# Patient Record
Sex: Female | Born: 1990 | Race: Black or African American | Hispanic: No | Marital: Single | Smoking: Current some day smoker
Health system: Southern US, Community
[De-identification: ages and names within clinical notes are randomized; demographics above are authoritative.]

## PROBLEM LIST (undated history)

## (undated) ENCOUNTER — Inpatient Hospital Stay (HOSPITAL_COMMUNITY): Payer: Self-pay

## (undated) DIAGNOSIS — N159 Renal tubulo-interstitial disease, unspecified: Secondary | ICD-10-CM

## (undated) DIAGNOSIS — R51 Headache: Secondary | ICD-10-CM

## (undated) DIAGNOSIS — R519 Headache, unspecified: Secondary | ICD-10-CM

---
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
## 2014-04-30 LAB — OB RESULTS CONSOLE ANTIBODY SCREEN: Antibody Screen: NEGATIVE

## 2014-04-30 LAB — OB RESULTS CONSOLE HEPATITIS B SURFACE ANTIGEN: HEP B S AG: NEGATIVE

## 2014-04-30 LAB — OB RESULTS CONSOLE HIV ANTIBODY (ROUTINE TESTING): HIV: NONREACTIVE

## 2014-04-30 LAB — OB RESULTS CONSOLE ABO/RH: RH Type: POSITIVE

## 2014-04-30 LAB — OB RESULTS CONSOLE RUBELLA ANTIBODY, IGM: Rubella: IMMUNE

## 2014-04-30 LAB — OB RESULTS CONSOLE GC/CHLAMYDIA
Chlamydia: NEGATIVE
Gonorrhea: NEGATIVE

## 2014-04-30 LAB — OB RESULTS CONSOLE RPR: RPR: NONREACTIVE

## 2014-05-19 ENCOUNTER — Encounter: Payer: Medicaid Other | Admitting: Obstetrics

## 2014-07-03 ENCOUNTER — Encounter (HOSPITAL_COMMUNITY): Payer: Self-pay | Admitting: *Deleted

## 2014-07-03 ENCOUNTER — Inpatient Hospital Stay (HOSPITAL_COMMUNITY)
Admission: AD | Admit: 2014-07-03 | Discharge: 2014-07-03 | Disposition: A | Discharge status: Home / Self Care | Payer: BLUE CROSS/BLUE SHIELD | Source: Ambulatory Visit | Attending: Obstetrics and Gynecology | Admitting: Obstetrics and Gynecology

## 2014-07-03 DIAGNOSIS — O9989 Other specified diseases and conditions complicating pregnancy, childbirth and the puerperium: Secondary | ICD-10-CM | POA: Diagnosis present

## 2014-07-03 DIAGNOSIS — O99342 Other mental disorders complicating pregnancy, second trimester: Secondary | ICD-10-CM | POA: Insufficient documentation

## 2014-07-03 DIAGNOSIS — F418 Other specified anxiety disorders: Secondary | ICD-10-CM | POA: Diagnosis not present

## 2014-07-03 DIAGNOSIS — N189 Chronic kidney disease, unspecified: Secondary | ICD-10-CM | POA: Diagnosis not present

## 2014-07-03 DIAGNOSIS — Z3A25 25 weeks gestation of pregnancy: Secondary | ICD-10-CM | POA: Insufficient documentation

## 2014-07-03 DIAGNOSIS — F419 Anxiety disorder, unspecified: Secondary | ICD-10-CM | POA: Diagnosis not present

## 2014-07-03 DIAGNOSIS — O26832 Pregnancy related renal disease, second trimester: Secondary | ICD-10-CM | POA: Diagnosis not present

## 2014-07-03 HISTORY — DX: Renal tubulo-interstitial disease, unspecified: N15.9

## 2014-07-03 LAB — URINALYSIS, ROUTINE W REFLEX MICROSCOPIC
Bilirubin Urine: NEGATIVE
Glucose, UA: NEGATIVE mg/dL
Ketones, ur: NEGATIVE mg/dL
Nitrite: NEGATIVE
Protein, ur: 30 mg/dL — AB
Specific Gravity, Urine: 1.01 (ref 1.005–1.030)
Urobilinogen, UA: 0.2 mg/dL (ref 0.0–1.0)
pH: 7 (ref 5.0–8.0)

## 2014-07-03 LAB — URINE MICROSCOPIC-ADD ON
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
## 2014-07-03 NOTE — Discharge Instructions (Signed)
Panic Attacks °Panic attacks are sudden, short feelings of great fear or discomfort. You may have them for no reason when you are relaxed, when you are uneasy (anxious), or when you are sleeping.  °HOME CARE °· Take all your medicines as told. °· Check with your doctor before starting new medicines. °· Keep all doctor visits. °GET HELP IF: °· You are not able to take your medicines as told. °· Your symptoms do not get better. °· Your symptoms get worse. °GET HELP RIGHT AWAY IF: °· Your attacks seem different than your normal attacks. °· You have thoughts about hurting yourself or others. °· You take panic attack medicine and you have a side effect. °MAKE SURE YOU: °· Understand these instructions. °· Will watch your condition. °· Will get help right away if you are not doing well or get worse. °Document Released: 06/18/2010 Document Revised: 03/06/2013 Document Reviewed: 12/28/2012 °ExitCare® Patient Information ©2015 [employer]. This information is not intended to replace advice given to you by your health care provider. Make sure you discuss any questions you have with your health care provider. ° °

## 2014-09-11 LAB — OB RESULTS CONSOLE GBS: STREP GROUP B AG: POSITIVE

## 2014-10-22 ENCOUNTER — Inpatient Hospital Stay (HOSPITAL_COMMUNITY)
Admission: RE | Admit: 2014-10-22 | Discharge: 2014-10-26 | DRG: 766 | Disposition: A | Discharge status: Home / Self Care | Payer: Medicaid Other | Source: Ambulatory Visit | Attending: Obstetrics & Gynecology | Admitting: Obstetrics & Gynecology

## 2014-10-22 ENCOUNTER — Encounter (HOSPITAL_COMMUNITY): Payer: Self-pay

## 2014-10-22 VITALS — BP 136/91 | HR 63 | Temp 97.4°F | Resp 16 | Ht 63.0 in | Wt 181.0 lb

## 2014-10-22 DIAGNOSIS — O480 Post-term pregnancy: Secondary | ICD-10-CM | POA: Diagnosis present

## 2014-10-22 DIAGNOSIS — Z3A40 40 weeks gestation of pregnancy: Secondary | ICD-10-CM | POA: Diagnosis present

## 2014-10-22 DIAGNOSIS — O99824 Streptococcus B carrier state complicating childbirth: Secondary | ICD-10-CM | POA: Diagnosis present

## 2014-10-22 DIAGNOSIS — Z98891 History of uterine scar from previous surgery: Secondary | ICD-10-CM

## 2014-10-22 LAB — CBC
HCT: 39.7 % (ref 36.0–46.0)
Hemoglobin: 13.8 g/dL (ref 12.0–15.0)
MCH: 33.1 pg (ref 26.0–34.0)
MCHC: 34.8 g/dL (ref 30.0–36.0)
MCV: 95.2 fL (ref 78.0–100.0)
Platelets: 320 10*3/uL (ref 150–400)
RBC: 4.17 MIL/uL (ref 3.87–5.11)
RDW: 13.2 % (ref 11.5–15.5)
WBC: 12.4 10*3/uL — ABNORMAL HIGH (ref 4.0–10.5)

## 2014-10-22 LAB — TYPE AND SCREEN
ABO/RH(D): O POS
Antibody Screen: NEGATIVE

## 2014-10-22 MED ORDER — OXYTOCIN BOLUS FROM INFUSION: 500.0000 mL | INTRAVENOUS | Status: DC

## 2014-10-22 MED ORDER — OXYTOCIN 40 UNITS IN LACTATED RINGERS INFUSION - SIMPLE MED: 62.5000 mL/h | INTRAVENOUS | Status: DC

## 2014-10-22 MED ORDER — OXYCODONE-ACETAMINOPHEN 5-325 MG PO TABS
2.0000 | ORAL_TABLET | ORAL | Status: DC | PRN
Start: 2014-10-22 — End: 2014-10-24

## 2014-10-22 MED ORDER — TERBUTALINE SULFATE 1 MG/ML IJ SOLN: 0.2500 mg | Freq: Once | INTRAMUSCULAR | Status: AC | PRN

## 2014-10-22 MED ORDER — MISOPROSTOL 25 MCG QUARTER TABLET: 25.0000 ug | ORAL_TABLET | ORAL | Status: DC | PRN

## 2014-10-22 MED ORDER — PENICILLIN G POTASSIUM 5000000 UNITS IJ SOLR
5.0000 10*6.[IU] | Freq: Once | INTRAVENOUS | Status: AC
  Administered 2014-10-23: 5 10*6.[IU] via INTRAVENOUS
  Filled 2014-10-22: qty 5

## 2014-10-22 MED ORDER — LACTATED RINGERS IV SOLN: 500.0000 mL | INTRAVENOUS | Status: DC | PRN

## 2014-10-22 MED ORDER — ACETAMINOPHEN 325 MG PO TABS: 650.0000 mg | ORAL_TABLET | ORAL | Status: DC | PRN

## 2014-10-22 MED ORDER — PENICILLIN G POTASSIUM 5000000 UNITS IJ SOLR
2.5000 10*6.[IU] | INTRAVENOUS | Status: DC
  Administered 2014-10-23 – 2014-10-24 (×3): 2.5 10*6.[IU] via INTRAVENOUS
  Filled 2014-10-22 (×8): qty 2.5

## 2014-10-22 MED ORDER — BUTORPHANOL TARTRATE 1 MG/ML IJ SOLN
1.0000 mg | INTRAMUSCULAR | Status: DC | PRN
  Administered 2014-10-23: 1 mg via INTRAVENOUS
  Filled 2014-10-22: qty 1

## 2014-10-22 MED ORDER — CITRIC ACID-SODIUM CITRATE 334-500 MG/5ML PO SOLN
30.0000 mL | ORAL | Status: DC | PRN
  Administered 2014-10-24: 30 mL via ORAL
  Filled 2014-10-22: qty 15

## 2014-10-22 MED ORDER — OXYCODONE-ACETAMINOPHEN 5-325 MG PO TABS: 1.0000 | ORAL_TABLET | ORAL | Status: DC | PRN

## 2014-10-22 MED ORDER — DIPHENHYDRAMINE HCL 25 MG PO CAPS
25.0000 mg | ORAL_CAPSULE | Freq: Every evening | ORAL | Status: DC | PRN
  Administered 2014-10-22: 25 mg via ORAL
  Filled 2014-10-22: qty 1

## 2014-10-22 MED ORDER — LIDOCAINE HCL (PF) 1 % IJ SOLN: 30.0000 mL | INTRAMUSCULAR | Status: DC | PRN

## 2014-10-22 MED ORDER — ONDANSETRON HCL 4 MG/2ML IJ SOLN
4.0000 mg | Freq: Four times a day (QID) | INTRAMUSCULAR | Status: DC | PRN
  Administered 2014-10-24: 4 mg via INTRAVENOUS

## 2014-10-22 MED ORDER — LACTATED RINGERS IV SOLN
INTRAVENOUS | Status: DC
  Administered 2014-10-22 – 2014-10-24 (×7): via INTRAVENOUS

## 2014-10-22 NOTE — Progress Notes (Signed)
Foley bulb placed and filled with 60 cc NS without difficulty. Toco: q2 min EFM: 150s, mod var, Cat 1 FSR

## 2014-10-22 NOTE — H&P (Signed)
24 y.o. G1P0 @ 4513w6d presents for IOL for post-term pregnancy.  Is noted to be contracting q2-3 min, reports mild to moderate discomfort.  Otherwise has good fetal movement and no bleeding.  Past Medical History  Diagnosis Date  . Kidney infection    History reviewed. No pertinent past surgical history.  OB History  Gravida Para Term Preterm AB SAB TAB Ectopic Multiple Living  1             # Outcome Date GA Lbr Len/2nd Weight Sex Delivery Anes PTL Lv  1 Current               History   Social History  . Marital Status: Single    Spouse Name: N/A  . Number of Children: N/A  . Years of Education: N/A   Occupational History  . Not on file.   Social History Main Topics  . Smoking status: Never Smoker   . Smokeless tobacco: Not on file  . Alcohol Use: No  . Drug Use: No  . Sexual Activity: Yes   Other Topics Concern  . Not on file   Social History Narrative   Review of patient's allergies indicates no known allergies.    Prenatal Transfer Tool  Maternal Diabetes: No Genetic Screening: Normal Maternal Ultrasounds/Referrals: Normal Fetal Ultrasounds or other Referrals:  None Maternal Substance Abuse:  No Significant Maternal Medications:  None Significant Maternal Lab Results: Lab values include: Group B Strep positive  ABO, Rh: O/Positive/-- (12/02 0000) Antibody: Negative (12/02 0000) Rubella:  Immune RPR: Nonreactive (12/02 0000)  HBsAg: Negative (12/02 0000)  HIV: Non-reactive (12/02 0000)  GBS: Positive (04/14 0000)    Other PNC:  1. Chlamydia at 35 wks, s/p treatment    Filed Vitals:   10/22/14 2015  BP: 130/83  Pulse: 82  Resp: 18     General:  NAD Abdomen:  soft, gravid, EFW 6.5# Ex:  no edema SVE:  1/long/posterior/firm/ballotable FHTs:  120s, mod var, 10 x 10 accels, Cat 1 Toco:  q2 min   A/P   24 y.o. 7513w6d  G1P0 presents for IOL for post dates pregnancy IOL--contracting too frequently for cytotec.  Will place foley bulb Epidural upon  maternal request FSR/ vtx/ GBS positive--PCN  [facility]Joao Mccurdy GEFFEL Aliahna Statzer

## 2014-10-23 ENCOUNTER — Inpatient Hospital Stay (HOSPITAL_COMMUNITY): Payer: Medicaid Other | Admitting: Anesthesiology

## 2014-10-23 LAB — RPR: RPR Ser Ql: NONREACTIVE

## 2014-10-23 LAB — ABO/RH: ABO/RH(D): O POS

## 2014-10-23 MED ORDER — OXYTOCIN 40 UNITS IN LACTATED RINGERS INFUSION - SIMPLE MED
1.0000 m[IU]/min | INTRAVENOUS | Status: DC
  Administered 2014-10-23: 2 m[IU]/min via INTRAVENOUS
  Filled 2014-10-23: qty 1000

## 2014-10-23 MED ORDER — PHENYLEPHRINE 40 MCG/ML (10ML) SYRINGE FOR IV PUSH (FOR BLOOD PRESSURE SUPPORT): 80.0000 ug | PREFILLED_SYRINGE | INTRAVENOUS | Status: DC | PRN

## 2014-10-23 MED ORDER — DIPHENHYDRAMINE HCL 50 MG/ML IJ SOLN: 12.5000 mg | INTRAMUSCULAR | Status: DC | PRN

## 2014-10-23 MED ORDER — TERBUTALINE SULFATE 1 MG/ML IJ SOLN: 0.2500 mg | Freq: Once | INTRAMUSCULAR | Status: AC | PRN

## 2014-10-23 MED ORDER — EPHEDRINE 5 MG/ML INJ: 10.0000 mg | INTRAVENOUS | Status: DC | PRN

## 2014-10-23 MED ORDER — PHENYLEPHRINE 40 MCG/ML (10ML) SYRINGE FOR IV PUSH (FOR BLOOD PRESSURE SUPPORT)
80.0000 ug | PREFILLED_SYRINGE | INTRAVENOUS | Status: DC | PRN
  Administered 2014-10-23 (×2): 80 ug via INTRAVENOUS

## 2014-10-23 MED ORDER — LIDOCAINE HCL (PF) 1 % IJ SOLN
INTRAMUSCULAR | Status: DC | PRN
  Administered 2014-10-23 (×2): 4 mL

## 2014-10-23 MED ORDER — PHENYLEPHRINE 40 MCG/ML (10ML) SYRINGE FOR IV PUSH (FOR BLOOD PRESSURE SUPPORT)
80.0000 ug | PREFILLED_SYRINGE | INTRAVENOUS | Status: DC | PRN
  Filled 2014-10-23: qty 20

## 2014-10-23 MED ORDER — FENTANYL 2.5 MCG/ML BUPIVACAINE 1/10 % EPIDURAL INFUSION (WH - ANES)
13.0000 mL/h | INTRAMUSCULAR | Status: DC | PRN
  Administered 2014-10-23 (×2): 13 mL/h via EPIDURAL

## 2014-10-23 MED ORDER — FENTANYL 2.5 MCG/ML BUPIVACAINE 1/10 % EPIDURAL INFUSION (WH - ANES)
14.0000 mL/h | INTRAMUSCULAR | Status: DC | PRN
  Administered 2014-10-23: 13 mL/h via EPIDURAL
  Filled 2014-10-23 (×2): qty 125

## 2014-10-23 NOTE — Anesthesia Procedure Notes (Signed)
Epidural Patient location during procedure: OB Start time: 10/23/2014 12:29 PM  Staffing Anesthesiologist: Mal AmabileFOSTER, Srihitha Tagliaferri Performed by: anesthesiologist   Preanesthetic Checklist Completed: patient identified, site marked, surgical consent, pre-op evaluation, timeout performed, IV checked, risks and benefits discussed and monitors and equipment checked  Epidural Patient position: sitting Prep: site prepped and draped and DuraPrep Patient monitoring: continuous pulse ox and blood pressure Approach: midline Location: L3-L4 Injection technique: LOR air  Needle:  Needle type: Tuohy  Needle gauge: 17 G Needle length: 9 cm and 9 Needle insertion depth: 7 cm Catheter type: closed end flexible Catheter size: 19 Gauge Catheter at skin depth: 12 cm Test dose: negative and Other  Assessment Events: blood not aspirated, injection not painful, no injection resistance, negative IV test and no paresthesia  Additional Notes Patient identified. Risks and benefits discussed including failed block, incomplete  Pain control, post dural puncture headache, nerve damage, paralysis, blood pressure Changes, nausea, vomiting, reactions to medications-both toxic and allergic and post Partum back pain. All questions were answered. Patient expressed understanding and wished to proceed. Sterile technique was used throughout procedure. Epidural site was Dressed with sterile barrier dressing. No paresthesias, signs of intravascular injection Or signs of intrathecal spread were encountered.  Patient was more comfortable after the epidural was dosed. Please see RN's note for documentation of vital signs and FHR which are stable.

## 2014-10-23 NOTE — Progress Notes (Signed)
Whitney Ayala is a [age] y.o. G1P0 at 4454w0d by LMP admitted for induction of labor due to Post dates.   Subjective: Patient more comfortable  Objective: BP 136/81 mmHg  Pulse 92  Temp(Src) 97.8 F (36.6 C) (Oral)  Resp 20  Ht 5\' 3"  (1.6 m)  Wt 82.101 kg (181 lb)  BMI 32.07 kg/m2  LMP 01/11/2014      FHT:  FHR: 140 bpm, variability: moderate,  accelerations:  Present,  decelerations:  Absent and   UC:   regular, every 2 minutes SVE:   Dilation: 1 Effacement (%): Thick Station: Ballotable Exam by:: Dr Kemper Durielarke  Labs: Lab Results  Component Value Date   WBC 12.4* 10/22/2014   HGB 13.8 10/22/2014   HCT 39.7 10/22/2014   MCV 95.2 10/22/2014   PLT 320 10/22/2014    Assessment / Plan: Induction of labor due to postterm,  progressing well on pitocin  Labor: early labor Preeclampsia:  no signs or symptoms of toxicity Fetal Wellbeing:  Category I Pain Control:  Labor support without medications I/D:  n/a Anticipated MOD:  NSVD  Whitney Ayala STACIA 10/23/2014, 9:01 AM

## 2014-10-23 NOTE — Anesthesia Preprocedure Evaluation (Addendum)
Anesthesia Evaluation  Patient identified by MRN, date of birth, ID band Patient awake    Reviewed: Allergy & Precautions, H&P , Patient's Chart, lab work & pertinent test results  History of Anesthesia Complications Negative for: history of anesthetic complications  Airway Mallampati: III  TM Distance: >3 FB Neck ROM: full    Dental no notable dental hx. (+) Teeth Intact   Pulmonary neg pulmonary ROS,  breath sounds clear to auscultation  Pulmonary exam normal       Cardiovascular negative cardio ROS Normal cardiovascular examRhythm:regular Rate:Normal     Neuro/Psych negative neurological ROS  negative psych ROS   GI/Hepatic negative GI ROS, Neg liver ROS,   Endo/Other  Obesity  Renal/GU Renal disease  negative genitourinary   Musculoskeletal   Abdominal   Peds  Hematology negative hematology ROS (+)   Anesthesia Other Findings   Reproductive/Obstetrics (+) Pregnancy                            Anesthesia Physical Anesthesia Plan  ASA: II  Anesthesia Plan: Epidural   Post-op Pain Management:    Induction:   Airway Management Planned: Natural Airway  Additional Equipment: None  Intra-op Plan:   Post-operative Plan:   Informed Consent: I have reviewed the patients History and Physical, chart, labs and discussed the procedure including the risks, benefits and alternatives for the proposed anesthesia with the patient or authorized representative who has indicated his/her understanding and acceptance.   Dental advisory given  Plan Discussed with: CRNA and Surgeon  Anesthesia Plan Comments:        Anesthesia Quick Evaluation

## 2014-10-23 NOTE — Progress Notes (Signed)
Whitney Ayala is a 24 y.o. G1P0 at 2569w0d by LMP admitted for induction of labor due to Post dates.   Subjective: Patient comfortable with epidural   Objective: BP 106/53 mmHg  Pulse 82  Temp(Src) 97.8 F (36.6 C) (Oral)  Resp 20  Ht 5\' 3"  (1.6 m)  Wt 82.101 kg (181 lb)  BMI 32.07 kg/m2  SpO2 100%  LMP 01/11/2014      FHT:  FHR: 135 bpm, variability: moderate,  accelerations:  Present,  decelerations:  Absent UC:   regular, every 3 minutes SVE:   Dilation: 6 Effacement (%): 90 Station: -2 Exam by:: Dr Mora ApplPinn AROM - clear fluid  Labs: Lab Results  Component Value Date   WBC 12.4* 10/22/2014   HGB 13.8 10/22/2014   HCT 39.7 10/22/2014   MCV 95.2 10/22/2014   PLT 320 10/22/2014    Assessment / Plan: Induction of labor due to postterm,  progressing well on pitocin  Labor: progressing on pitocin Preeclampsia:  no signs or symptoms of toxicity Fetal Wellbeing:  Category I Pain Control:  Epidural I/D:  n/a Anticipated MOD:  NSVD  Whitney Ayala Whitney Ayala 10/23/2014, 3:39 PM

## 2014-10-23 NOTE — Progress Notes (Signed)
Whitney Ayala is a 24 y.o. G1P0 at 4034w0d by LMP admitted for induction of labor due to Post dates.   Subjective: Patient comfortable with epidural  Objective: BP 136/89 mmHg  Pulse 111  Temp(Src) 99.6 F (37.6 C) (Axillary)  Resp 20  Ht 5\' 3"  (1.6 m)  Wt 82.101 kg (181 lb)  BMI 32.07 kg/m2  SpO2 100%  LMP 01/11/2014   Total I/O In: -  Out: 375 [Urine:375]  FHT:  FHR: 140 bpm, variability: moderate,  accelerations:  Present,  decelerations:  Absent UC:   regular, every 1-2 minutes SVE:   Dilation: 6 Effacement (%): 70 Station: -1 Exam by:: Lauraine RinneKristin Fraley, RN  Labs: Lab Results  Component Value Date   WBC 12.4* 10/22/2014   HGB 13.8 10/22/2014   HCT 39.7 10/22/2014   MCV 95.2 10/22/2014   PLT 320 10/22/2014    Assessment / Plan: SIUP at 41 weeks admitted for IOL stalled at 6 cm IUPC placed for monitoring of MVUs Patient placed on side with the peanut.  We discussed the possibility of arrest of labor and cesarean delivery, will closely monitor.   Whitney Ayala, Whitney Ayala Whitney Ayala 10/23/2014, 11:03 PM

## 2014-10-24 ENCOUNTER — Encounter (HOSPITAL_COMMUNITY)
Admission: RE | Disposition: A | Discharge status: Home / Self Care | Payer: Self-pay | Source: Ambulatory Visit | Attending: Obstetrics & Gynecology

## 2014-10-24 ENCOUNTER — Encounter (HOSPITAL_COMMUNITY): Payer: Self-pay

## 2014-10-24 DIAGNOSIS — Z98891 History of uterine scar from previous surgery: Secondary | ICD-10-CM

## 2014-10-24 SURGERY — Surgical Case
Anesthesia: Epidural | Site: Abdomen

## 2014-10-24 MED ORDER — NALOXONE HCL 0.4 MG/ML IJ SOLN: 0.4000 mg | INTRAMUSCULAR | Status: DC | PRN

## 2014-10-24 MED ORDER — NALOXONE HCL 1 MG/ML IJ SOLN
1.0000 ug/kg/h | INTRAVENOUS | Status: DC | PRN
  Filled 2014-10-24: qty 2

## 2014-10-24 MED ORDER — BUPIVACAINE LIPOSOME 1.3 % IJ SUSP
20.0000 mL | Freq: Once | INTRAMUSCULAR | Status: DC
  Filled 2014-10-24: qty 20

## 2014-10-24 MED ORDER — DIPHENHYDRAMINE HCL 25 MG PO CAPS: 25.0000 mg | ORAL_CAPSULE | Freq: Four times a day (QID) | ORAL | Status: DC | PRN

## 2014-10-24 MED ORDER — LIDOCAINE-EPINEPHRINE (PF) 2 %-1:200000 IJ SOLN
INTRAMUSCULAR | Status: AC
  Filled 2014-10-24: qty 20

## 2014-10-24 MED ORDER — OXYTOCIN 10 UNIT/ML IJ SOLN
INTRAMUSCULAR | Status: AC
  Filled 2014-10-24: qty 4

## 2014-10-24 MED ORDER — OXYCODONE-ACETAMINOPHEN 5-325 MG PO TABS: 2.0000 | ORAL_TABLET | ORAL | Status: DC | PRN

## 2014-10-24 MED ORDER — SODIUM BICARBONATE 8.4 % IV SOLN
INTRAVENOUS | Status: DC | PRN
Start: 2014-10-24 — End: 2014-11-03
  Administered 2014-10-24: 10 mL via EPIDURAL
  Administered 2014-10-24: 5 mL via EPIDURAL

## 2014-10-24 MED ORDER — SIMETHICONE 80 MG PO CHEW: 80.0000 mg | CHEWABLE_TABLET | ORAL | Status: DC | PRN

## 2014-10-24 MED ORDER — OXYTOCIN 40 UNITS IN LACTATED RINGERS INFUSION - SIMPLE MED: 62.5000 mL/h | INTRAVENOUS | Status: AC

## 2014-10-24 MED ORDER — PHENYLEPHRINE HCL 10 MG/ML IJ SOLN
INTRAMUSCULAR | Status: DC | PRN
  Administered 2014-10-24: 40 ug via INTRAVENOUS
  Administered 2014-10-24: 80 ug via INTRAVENOUS

## 2014-10-24 MED ORDER — SODIUM BICARBONATE 8.4 % IV SOLN
INTRAVENOUS | Status: AC
  Filled 2014-10-24: qty 50

## 2014-10-24 MED ORDER — CEFAZOLIN SODIUM-DEXTROSE 2-3 GM-% IV SOLR
INTRAVENOUS | Status: AC
  Filled 2014-10-24: qty 50

## 2014-10-24 MED ORDER — DIPHENHYDRAMINE HCL 25 MG PO CAPS: 25.0000 mg | ORAL_CAPSULE | ORAL | Status: DC | PRN

## 2014-10-24 MED ORDER — FENTANYL CITRATE (PF) 100 MCG/2ML IJ SOLN: 25.0000 ug | INTRAMUSCULAR | Status: DC | PRN

## 2014-10-24 MED ORDER — SIMETHICONE 80 MG PO CHEW
80.0000 mg | CHEWABLE_TABLET | ORAL | Status: DC
  Administered 2014-10-25 – 2014-10-26 (×2): 80 mg via ORAL
  Filled 2014-10-24 (×3): qty 1

## 2014-10-24 MED ORDER — ONDANSETRON HCL 4 MG/2ML IJ SOLN
INTRAMUSCULAR | Status: AC
  Filled 2014-10-24: qty 2

## 2014-10-24 MED ORDER — ONDANSETRON HCL 4 MG/2ML IJ SOLN: 4.0000 mg | Freq: Three times a day (TID) | INTRAMUSCULAR | Status: DC | PRN

## 2014-10-24 MED ORDER — SODIUM CHLORIDE 0.9 % IJ SOLN
INTRAMUSCULAR | Status: DC | PRN
  Administered 2014-10-24: 10 mL via INTRAVENOUS

## 2014-10-24 MED ORDER — MENTHOL 3 MG MT LOZG: 1.0000 | LOZENGE | OROMUCOSAL | Status: DC | PRN

## 2014-10-24 MED ORDER — CEFAZOLIN SODIUM-DEXTROSE 2-3 GM-% IV SOLR
2.0000 g | INTRAVENOUS | Status: AC
  Administered 2014-10-24: 2 g via INTRAVENOUS
  Filled 2014-10-24: qty 50

## 2014-10-24 MED ORDER — NALBUPHINE HCL 10 MG/ML IJ SOLN: 5.0000 mg | INTRAMUSCULAR | Status: DC | PRN

## 2014-10-24 MED ORDER — MORPHINE SULFATE 0.5 MG/ML IJ SOLN
INTRAMUSCULAR | Status: AC
  Filled 2014-10-24: qty 10

## 2014-10-24 MED ORDER — ZOLPIDEM TARTRATE 5 MG PO TABS: 5.0000 mg | ORAL_TABLET | Freq: Every evening | ORAL | Status: DC | PRN

## 2014-10-24 MED ORDER — SODIUM CHLORIDE 0.9 % IJ SOLN
INTRAMUSCULAR | Status: AC
  Filled 2014-10-24: qty 10

## 2014-10-24 MED ORDER — NALBUPHINE HCL 10 MG/ML IJ SOLN: 5.0000 mg | Freq: Once | INTRAMUSCULAR | Status: AC | PRN

## 2014-10-24 MED ORDER — IBUPROFEN 600 MG PO TABS
600.0000 mg | ORAL_TABLET | Freq: Four times a day (QID) | ORAL | Status: DC
  Administered 2014-10-25 – 2014-10-26 (×7): 600 mg via ORAL
  Filled 2014-10-24 (×8): qty 1

## 2014-10-24 MED ORDER — SIMETHICONE 80 MG PO CHEW
80.0000 mg | CHEWABLE_TABLET | Freq: Three times a day (TID) | ORAL | Status: DC
  Administered 2014-10-24 – 2014-10-26 (×5): 80 mg via ORAL
  Filled 2014-10-24 (×6): qty 1

## 2014-10-24 MED ORDER — SODIUM CHLORIDE 0.9 % IJ SOLN: 3.0000 mL | INTRAMUSCULAR | Status: DC | PRN

## 2014-10-24 MED ORDER — SCOPOLAMINE 1 MG/3DAYS TD PT72
1.0000 | MEDICATED_PATCH | Freq: Once | TRANSDERMAL | Status: DC
  Filled 2014-10-24: qty 1

## 2014-10-24 MED ORDER — MORPHINE SULFATE (PF) 0.5 MG/ML IJ SOLN: INTRAMUSCULAR | Status: DC | PRN

## 2014-10-24 MED ORDER — DIPHENHYDRAMINE HCL 50 MG/ML IJ SOLN: 12.5000 mg | INTRAMUSCULAR | Status: DC | PRN

## 2014-10-24 MED ORDER — TETANUS-DIPHTH-ACELL PERTUSSIS 5-2.5-18.5 LF-MCG/0.5 IM SUSP: 0.5000 mL | Freq: Once | INTRAMUSCULAR | Status: DC

## 2014-10-24 MED ORDER — KETOROLAC TROMETHAMINE 30 MG/ML IJ SOLN
30.0000 mg | Freq: Four times a day (QID) | INTRAMUSCULAR | Status: AC | PRN
  Administered 2014-10-24: 30 mg via INTRAVENOUS
  Filled 2014-10-24: qty 1

## 2014-10-24 MED ORDER — MEPERIDINE HCL 25 MG/ML IJ SOLN: 6.2500 mg | INTRAMUSCULAR | Status: DC | PRN

## 2014-10-24 MED ORDER — KETOROLAC TROMETHAMINE 30 MG/ML IJ SOLN
INTRAMUSCULAR | Status: AC
  Administered 2014-10-24: 30 mg via INTRAVENOUS
  Filled 2014-10-24: qty 1

## 2014-10-24 MED ORDER — OXYTOCIN 10 UNIT/ML IJ SOLN
40.0000 [IU] | INTRAVENOUS | Status: DC | PRN
  Administered 2014-10-24: 40 [IU] via INTRAVENOUS

## 2014-10-24 MED ORDER — KETOROLAC TROMETHAMINE 30 MG/ML IJ SOLN
30.0000 mg | Freq: Four times a day (QID) | INTRAMUSCULAR | Status: AC | PRN
  Administered 2014-10-24: 30 mg via INTRAMUSCULAR

## 2014-10-24 MED ORDER — DIBUCAINE 1 % RE OINT: 1.0000 "application " | TOPICAL_OINTMENT | RECTAL | Status: DC | PRN

## 2014-10-24 MED ORDER — OXYCODONE-ACETAMINOPHEN 5-325 MG PO TABS
1.0000 | ORAL_TABLET | ORAL | Status: DC | PRN
  Administered 2014-10-25 – 2014-10-26 (×3): 1 via ORAL
  Filled 2014-10-24 (×3): qty 1

## 2014-10-24 MED ORDER — ACETAMINOPHEN 325 MG PO TABS: 650.0000 mg | ORAL_TABLET | ORAL | Status: DC | PRN

## 2014-10-24 MED ORDER — BUPIVACAINE LIPOSOME 1.3 % IJ SUSP
INTRAMUSCULAR | Status: DC | PRN
  Administered 2014-10-24: 20 mL

## 2014-10-24 MED ORDER — SENNOSIDES-DOCUSATE SODIUM 8.6-50 MG PO TABS
2.0000 | ORAL_TABLET | ORAL | Status: DC
  Administered 2014-10-25: 2 via ORAL
  Administered 2014-10-26 (×2): 1 via ORAL
  Filled 2014-10-24 (×3): qty 2

## 2014-10-24 MED ORDER — PRENATAL MULTIVITAMIN CH
1.0000 | ORAL_TABLET | Freq: Every day | ORAL | Status: DC
Start: 2014-10-24 — End: 2014-10-26
  Administered 2014-10-24 – 2014-10-26 (×3): 1 via ORAL
  Filled 2014-10-24 (×3): qty 1

## 2014-10-24 MED ORDER — LACTATED RINGERS IV SOLN
INTRAVENOUS | Status: DC
  Administered 2014-10-24 (×2): via INTRAVENOUS

## 2014-10-24 MED ORDER — LANOLIN HYDROUS EX OINT: 1.0000 "application " | TOPICAL_OINTMENT | CUTANEOUS | Status: DC | PRN

## 2014-10-24 MED ORDER — MORPHINE SULFATE (PF) 0.5 MG/ML IJ SOLN
INTRAMUSCULAR | Status: DC | PRN
  Administered 2014-10-24: 3000 ug via EPIDURAL

## 2014-10-24 MED ORDER — WITCH HAZEL-GLYCERIN EX PADS: 1.0000 "application " | MEDICATED_PAD | CUTANEOUS | Status: DC | PRN

## 2014-10-24 SURGICAL SUPPLY — 38 items
APL SKNCLS STERI-STRIP NONHPOA (GAUZE/BANDAGES/DRESSINGS) ×1
BENZOIN TINCTURE PRP APPL 2/3 (GAUZE/BANDAGES/DRESSINGS) ×3 IMPLANT
CLAMP CORD UMBIL (MISCELLANEOUS) ×2 IMPLANT
CLOSURE WOUND 1/2 X4 (GAUZE/BANDAGES/DRESSINGS) ×1
CLOTH BEACON ORANGE TIMEOUT ST (SAFETY) ×3 IMPLANT
DRAPE SHEET LG 3/4 BI-LAMINATE (DRAPES) IMPLANT
DRSG OPSITE POSTOP 4X10 (GAUZE/BANDAGES/DRESSINGS) ×3 IMPLANT
DURAPREP 26ML APPLICATOR (WOUND CARE) ×3 IMPLANT
ELECT REM PT RETURN 9FT ADLT (ELECTROSURGICAL) ×3
ELECTRODE REM PT RTRN 9FT ADLT (ELECTROSURGICAL) ×1 IMPLANT
EXTRACTOR VACUUM BELL STYLE (SUCTIONS) IMPLANT
GLOVE BIO SURGEON STRL SZ 6.5 (GLOVE) ×2 IMPLANT
GLOVE BIO SURGEONS STRL SZ 6.5 (GLOVE) ×1
GLOVE BIOGEL PI IND STRL 7.0 (GLOVE) ×1 IMPLANT
GLOVE BIOGEL PI INDICATOR 7.0 (GLOVE) ×2
GOWN STRL REUS W/TWL LRG LVL3 (GOWN DISPOSABLE) ×9 IMPLANT
KIT ABG SYR 3ML LUER SLIP (SYRINGE) IMPLANT
NDL HYPO 25X5/8 SAFETYGLIDE (NEEDLE) IMPLANT
NEEDLE HYPO 22GX1.5 SAFETY (NEEDLE) IMPLANT
NEEDLE HYPO 25X5/8 SAFETYGLIDE (NEEDLE) IMPLANT
NS IRRIG 1000ML POUR BTL (IV SOLUTION) ×3 IMPLANT
PACK C SECTION WH (CUSTOM PROCEDURE TRAY) ×3 IMPLANT
PAD OB MATERNITY 4.3X12.25 (PERSONAL CARE ITEMS) ×3 IMPLANT
RTRCTR C-SECT PINK 25CM LRG (MISCELLANEOUS) ×3 IMPLANT
STAPLER VISISTAT 35W (STAPLE) IMPLANT
STRIP CLOSURE SKIN 1/2X4 (GAUZE/BANDAGES/DRESSINGS) ×2 IMPLANT
SUT CHROMIC 2 0 CT 1 (SUTURE) ×6 IMPLANT
SUT MNCRL 0 VIOLET CTX 36 (SUTURE) ×2 IMPLANT
SUT MONOCRYL 0 CTX 36 (SUTURE) ×4
SUT PDS AB 0 CTX 36 PDP370T (SUTURE) IMPLANT
SUT PLAIN 2 0 (SUTURE) ×3
SUT PLAIN ABS 2-0 CT1 27XMFL (SUTURE) IMPLANT
SUT VIC AB 0 CTX 36 (SUTURE) ×3
SUT VIC AB 0 CTX36XBRD ANBCTRL (SUTURE) ×1 IMPLANT
SUT VIC AB 4-0 KS 27 (SUTURE) ×3 IMPLANT
SYR CONTROL 10ML LL (SYRINGE) ×2 IMPLANT
TOWEL OR 17X24 6PK STRL BLUE (TOWEL DISPOSABLE) ×3 IMPLANT
TRAY FOLEY CATH SILVER 14FR (SET/KITS/TRAYS/PACK) ×1 IMPLANT

## 2014-10-24 NOTE — Progress Notes (Signed)
Patient ID: Whitney ShipperAmber D Ayala, female   DOB: 1991/02/01, 24 y.o.   MRN: [id number]  Patient has been 6 cm for more than 10 hours now with adequate contractions.  The fetal heart tracing overall has been reassuring but recently has been category II,  With occasional late decelerations.   Discussed with patient that it appears that she has arrested her labor at 6cm and my  Recommendation is to proceed with a cesarean delivery.   She voiced understanding and agrees to proceed.  Patient counseled about the risks of cesarean delivery To include: infection, hemorrhage, injury.  Patient voiced understanding and all questions answered.   To OR for primary cesarean section.   Oshae Simmering STACIA

## 2014-10-24 NOTE — Brief Op Note (Signed)
10/22/2014 - 10/24/2014  6:49 AM  PATIENT:  Whitney Ayala  24 y.o. female  PRE-OPERATIVE DIAGNOSIS:  Failure to Progress  POST-OPERATIVE DIAGNOSIS:  Failure to Progress  PROCEDURE:  Procedure(s): CESAREAN SECTION (N/A)  SURGEON:  Surgeon(s) and Role:    * Essie HartWalda Makail Watling, MD - Primary  PHYSICIAN ASSISTANT:   ASSISTANTS: none   ANESTHESIA:   local and epidural  EBL:  Total I/O In: 1000 [I.V.:1000] Out: 1425 [Urine:425; Emesis/NG output:500; Blood:500]  BLOOD ADMINISTERED:none  DRAINS: Urinary Catheter (Foley)   LOCAL MEDICATIONS USED:  MARCAINE     SPECIMEN:  No Specimen  DISPOSITION OF SPECIMEN:  N/A  COUNTS:  YES  TOURNIQUET:  * No tourniquets in log *  DICTATION: .Note written in EPIC  PLAN OF CARE: Admit to inpatient   PATIENT DISPOSITION:  PACU - hemodynamically stable.   Delay start of Pharmacological VTE agent (>24hrs) due to surgical blood loss or risk of bleeding: not applicable  See full operative note  Ople Girgis STACIA

## 2014-10-24 NOTE — Op Note (Signed)
Cesarean Section Procedure Note  Indications: Failure to progress; Arrest of Labor  Pre-operative Diagnosis: 41 week 1 day gestation arrest of labor at 6 cm   Post-operative Diagnosis: same  Surgeon: Wynonia HazardPINN, Maurio Baize Ayala   Assistants: none  Anesthesia: epidural anesthesia  ASA Class: 2  Procedure Details   The patient was counseled about the risks, benefits, complications of the cesarean section using an interpreter. The patient concurred with the proposed plan, giving informed consent.  The site of surgery properly noted/marked. The patient was taken to Operating Room # 1, identified as Whitney Ayala and the procedure verified as C-Section Delivery. A Time Out was held and the above information confirmed.  After spinal anesthesia was found to be adequate, the patient was placed in the dorsal supine position with a leftward tilt, the fetal heart beat was difficult to find and then once found sounded sluggish in the 90's. The abdomen was splashed with betadine,and draped. A Pfannenstiel incision was made and carried down through the subcutaneous tissue to the fascia.  The fascia was incised in the midline and the fascial incision extended laterally with Mayo scissors. The fascia was tented up with Kocher clamps and dissected off the underlying rectus muscles sharply with the bovie and Mayo scissors. The rectus was separated in the midline, the peritoneum identified and entered bluntly with surgeons fingers.  The Alexis retractor was then deployed. The uterovesical peritoneum was identified and entered sharply with Metzenbaum scissors it was extended laterally creating the bladder flap. The scalpel was then used to make a low transverse incision on the uterus which was extended laterally with  blunt dissection. The fetal vertex was identified, and the head delivered with a nurse pushing on the uterine fundus.  The head was then delivered easily through the uterine incision followed by the body. The A  live female infant was bulb suctioned on the operative field cried vigorously, cord was clamped and cut and the infant was passed to the waiting neonatologist. Apgars 8/9. Placenta was then delivered spontaneously, intact and appear normal, the uterus was cleared of all clot and debris. The uterine incision was repaired with #0 Monocryl in running locked fashion. A second imbricating suture was performed using the same suture. The incision was still oozing so several figure of eight sutures of 0-Monocryl creating hemostasis. Ovaries and tubes were inspected and normal. The Alexis retractor was removed. The abdominal cavity was cleared of all clot and debris. The abdominal peritoneum was reapproximated with 2-0 chromic  in a running fashion, the rectus muscles was reapproximated with #2 chromic in interrupted fashion. The fascia was closed with 0 Vicryl in a running fashion. The subcuticular layer was irrigated and all bleeders cauterized.  The subcutaneous tissue was injected with Espiril and then re-approximated with 2-0 plain.  The skin was closed with 4-0 vicryl in a subcuticular fashion using a Mellody DanceKeith needle. The incision was dressed with benzoine, steri strips and pressure dressing. All sponge lap and needle counts were correct x3. Patient tolerated the procedure well and recovered in stable condition following the procedure.  Instrument, sponge, and needle counts were correct prior the abdominal closure and at the conclusion of the case.   Findings: Live female infant, Apgars 8/9, clear amniotic fluid, normal appearing placenta, normal uterus, bilateral tubes and ovaries  Estimated Blood Loss: 500mL         Drains: Foley catheter         Specimens: Placenta to L&D  Implants: none         Complications:  None; patient tolerated the procedure well.         Disposition: PACU - hemodynamically stable.   Whitney Ayala

## 2014-10-24 NOTE — Anesthesia Postprocedure Evaluation (Signed)
  Anesthesia Post-op Note  Patient: Whitney Ayala  Procedure(s) Performed: Procedure(s): CESAREAN SECTION (N/A)  Patient Location: Mother/Baby  Anesthesia Type:Epidural  Level of Consciousness: awake  Airway and Oxygen Therapy: Patient Spontanous Breathing  Post-op Pain: mild  Post-op Assessment: Patient's Cardiovascular Status Stable and Respiratory Function Stable  Post-op Vital Signs: stable  Last Vitals:  Filed Vitals:   10/24/14 1547  BP: 125/79  Pulse: 72  Temp: 37.1 C  Resp: 18    Complications: No apparent anesthesia complications

## 2014-10-24 NOTE — Lactation Note (Signed)
This note was copied from the chart of Whitney [employer]mber Moat. Lactation Consultation Note: mom resting, reports baby has nursed 3 times- did very well right after delivery- nursed well with no pain. Baby asleep in bassinet at this time. BF brochure given with resources for support after DC. Asking about pumping and milk storage- asking for hand pump, in room- reviewed use and cleaning of pump. Asking about milk storage-reviewed with mom. Encouraged to use Baby and Me book as reference. No  Further questions at present. To call for assist prn  Patient Name: Whitney Ayala Today's Date: 10/24/2014 Reason for consult: Initial assessment   Maternal Data Formula Feeding for Exclusion: No Does the patient have breastfeeding experience prior to this delivery?: No  Feeding    LATCH Score/Interventions                      Lactation Tools Discussed/Used WIC Program: Yes Pump Review: Milk Storage Initiated by:: DW Date initiated:: 10/24/14   Consult Status Consult Status: Follow-up Date: 10/25/14 Follow-up type: In-patient    Whitney HoitWeeks, Sullivan Blasing D 10/24/2014, 3:38 PM

## 2014-10-24 NOTE — Transfer of Care (Signed)
Immediate Anesthesia Transfer of Care Note  Patient: Whitney Ayala  Procedure(s) Performed: Procedure(s): CESAREAN SECTION (N/A)  Patient Location: PACU  Anesthesia Type:Epidural  Level of Consciousness: awake, alert  and oriented  Airway & Oxygen Therapy: Patient Spontanous Breathing  Post-op Assessment: Report given to RN and Post -op Vital signs reviewed and stable  Post vital signs: Reviewed and stable  Last Vitals:  Filed Vitals:   10/24/14 0418  BP: 127/67  Pulse: 134  Temp: 37.7 C  Resp: 20    Complications: No apparent anesthesia complications

## 2014-10-25 LAB — CBC
HCT: 32 % — ABNORMAL LOW (ref 36.0–46.0)
Hemoglobin: 11.1 g/dL — ABNORMAL LOW (ref 12.0–15.0)
MCH: 32.6 pg (ref 26.0–34.0)
MCHC: 34.7 g/dL (ref 30.0–36.0)
MCV: 94.1 fL (ref 78.0–100.0)
PLATELETS: 219 10*3/uL (ref 150–400)
RBC: 3.4 MIL/uL — AB (ref 3.87–5.11)
RDW: 13.2 % (ref 11.5–15.5)
WBC: 21.3 10*3/uL — AB (ref 4.0–10.5)

## 2014-10-25 NOTE — Progress Notes (Signed)
Subjective: Postpartum Day 1: Cesarean Delivery Patient reports adequate pain control, breast feeding, no nausea or vomiting    Objective: Vital signs in last 24 hours: Temp:  [98.4 F (36.9 C)-99.2 F (37.3 C)] 98.4 F (36.9 C) (05/28 0405) Pulse Rate:  [67-99] 67 (05/28 0405) Resp:  [16-20] 18 (05/28 0405) BP: (116-138)/(71-82) 119/76 mmHg (05/28 0405) SpO2:  [98 %-100 %] 99 % (05/28 0010)  Physical Exam:  General: alert, cooperative and appears stated age Lochia: appropriate Uterine Fundus: firm Incision: healing well DVT Evaluation: No evidence of DVT seen on physical exam.   Recent Labs  10/22/14 2100 10/25/14 0717  HGB 13.8 11.1*  HCT 39.7 32.0*    Assessment/Plan: Status post Cesarean section. Doing well postoperatively.  Continue current care.  Donielle Kaigler H. 10/25/2014, 10:00 AM

## 2014-10-26 MED ORDER — OXYCODONE-ACETAMINOPHEN 5-325 MG PO TABS: 1.0000 | ORAL_TABLET | ORAL | Status: DC | PRN

## 2014-10-26 MED ORDER — DOCUSATE SODIUM 100 MG PO CAPS: 100.0000 mg | ORAL_CAPSULE | Freq: Two times a day (BID) | ORAL | Status: DC

## 2014-10-26 MED ORDER — IBUPROFEN 600 MG PO TABS
600.0000 mg | ORAL_TABLET | Freq: Four times a day (QID) | ORAL | Status: DC | PRN
Start: 2014-10-26 — End: 2017-03-23

## 2014-10-26 NOTE — Progress Notes (Addendum)
Subjective: Postpartum Day 1: Cesarean Delivery Patient reports pain well controlled, working on breast feeding.    Objective: Vital signs in last 24 hours: Temp:  [97.4 F (36.3 C)-98.3 F (36.8 C)] 97.4 F (36.3 C) (05/29 16100637) Pulse Rate:  [63-90] 63 (05/29 0637) Resp:  [16-20] 16 (05/29 0637) BP: (132-136)/(77-91) 136/91 mmHg (05/29 0637) SpO2:  [97 %-100 %] 100 % (05/29 [id number])  Physical Exam:  General: alert, cooperative and appears stated age Lochia: appropriate Uterine Fundus: firm Incision: healing well DVT Evaluation: No evidence of DVT seen on physical exam.   Recent Labs  10/25/14 0717  HGB 11.1*  HCT 32.0*    Assessment/Plan: Status post Cesarean section. Doing well postoperatively.  Continue current care.  Quillan Whitter H. 10/26/2014, 8:21 AM

## 2014-10-26 NOTE — Discharge Summary (Signed)
Obstetric Discharge Summary Reason for Admission: onset of labor Prenatal Procedures: NST and ultrasound Intrapartum Procedures: cesarean: low cervical, transverse Postpartum Procedures: none Complications-Operative and Postpartum: none HEMOGLOBIN  Date Value Ref Range Status  10/25/2014 11.1* 12.0 - 15.0 g/dL Final   HCT  Date Value Ref Range Status  10/25/2014 32.0* 36.0 - 46.0 % Final    Physical Exam:  General: alert, cooperative and appears stated age 75Lochia: appropriate Uterine Fundus: firm Incision: healing well DVT Evaluation: No evidence of DVT seen on physical exam.  Discharge Diagnoses: Term Pregnancy-delivered  Discharge Information: Date: 10/26/2014 Activity: pelvic rest Diet: routine Medications: Ibuprofen, Colace and Percocet Condition: improved Instructions: refer to practice specific booklet Discharge to: home Follow-up Information    Follow up with PINN, Sanjuana MaeWALDA STACIA, MD In 4 weeks.   Specialty:  Obstetrics and Gynecology   Why:  for a postpartum evaluation   Contact information:   [address] [place] [address] [phone]       Newborn Data: Live born female  Birth Weight: 7 lb 4.8 oz (3311 g) APGAR: 8, 9  Home with mother.  Whitney Ayala H. 10/26/2014, 12:17 PM

## 2014-10-27 ENCOUNTER — Encounter (HOSPITAL_COMMUNITY): Payer: Self-pay | Admitting: Obstetrics & Gynecology

## 2014-10-31 NOTE — Progress Notes (Signed)
Post discharge chart review completed.  

## 2014-11-03 ENCOUNTER — Encounter (HOSPITAL_COMMUNITY): Payer: Self-pay | Admitting: Obstetrics & Gynecology

## 2017-03-21 ENCOUNTER — Emergency Department (HOSPITAL_COMMUNITY): Payer: Self-pay

## 2017-03-21 ENCOUNTER — Encounter (HOSPITAL_COMMUNITY): Payer: Self-pay

## 2017-03-21 ENCOUNTER — Emergency Department (HOSPITAL_COMMUNITY)
Admission: EM | Admit: 2017-03-21 | Discharge: 2017-03-22 | Disposition: A | Discharge status: Home / Self Care | Payer: Self-pay | Attending: Emergency Medicine | Admitting: Emergency Medicine

## 2017-03-21 DIAGNOSIS — Y998 Other external cause status: Secondary | ICD-10-CM | POA: Insufficient documentation

## 2017-03-21 DIAGNOSIS — Y9389 Activity, other specified: Secondary | ICD-10-CM | POA: Insufficient documentation

## 2017-03-21 DIAGNOSIS — Z79899 Other long term (current) drug therapy: Secondary | ICD-10-CM | POA: Insufficient documentation

## 2017-03-21 DIAGNOSIS — W25XXXA Contact with sharp glass, initial encounter: Secondary | ICD-10-CM | POA: Insufficient documentation

## 2017-03-21 DIAGNOSIS — S61214A Laceration without foreign body of right ring finger without damage to nail, initial encounter: Secondary | ICD-10-CM | POA: Insufficient documentation

## 2017-03-21 DIAGNOSIS — S61412A Laceration without foreign body of left hand, initial encounter: Secondary | ICD-10-CM

## 2017-03-21 DIAGNOSIS — Y92038 Other place in apartment as the place of occurrence of the external cause: Secondary | ICD-10-CM | POA: Insufficient documentation

## 2017-03-21 DIAGNOSIS — S61212A Laceration without foreign body of right middle finger without damage to nail, initial encounter: Secondary | ICD-10-CM | POA: Insufficient documentation

## 2017-03-21 MED ORDER — ONDANSETRON 4 MG PO TBDP
4.0000 mg | ORAL_TABLET | Freq: Once | ORAL | Status: AC
  Administered 2017-03-21: 4 mg via ORAL
  Filled 2017-03-21: qty 1

## 2017-03-21 MED ORDER — LIDOCAINE HCL 2 % IJ SOLN
10.0000 mL | Freq: Once | INTRAMUSCULAR | Status: AC
  Administered 2017-03-21: 200 mg
  Filled 2017-03-21: qty 20

## 2017-03-21 NOTE — ED Provider Notes (Signed)
[facility] EMERGENCY DEPARTMENT Provider Note   CSN: [id number] Arrival date & time: 03/21/17  1934     History   Chief Complaint Chief Complaint  Patient presents with  . Laceration    HPI Whitney Ayala is a 26 y.o. female presenting with multiple lacerations.  Patient states that she was trying to break a window to get into her apartment.  She sustained laceration of the right ring finger, left dorsal hand, and left elbow.  Bleeding was easily controlled.  She denies any medical problems, is not on blood thinners.  She reports no loss of sensation or difficulty moving her right hand or fingers.  She reports she is unable to extend her left middle finger.  She denies sensation loss.  She has not taken anything for pain.  She denies significant pain at her elbow.  Tetanus was updated 2 years ago when she was pregnant.  Lacerations occurred just prior to arrival, around 7 PM tonight.  HPI  Past Medical History:  Diagnosis Date  . Kidney infection     Patient Active Problem List   Diagnosis Date Noted  . Status post cesarean delivery 10/24/2014  . Post-dates pregnancy 10/22/2014    Past Surgical History:  Procedure Laterality Date  . CESAREAN SECTION N/A 10/24/2014   Procedure: CESAREAN SECTION;  Surgeon: Essie Hart, MD;  Location: [facility];  Service: Obstetrics;  Laterality: N/A;    OB History    Gravida Para Term Preterm AB Living   1 1 1      0   SAB TAB Ectopic Multiple Live Births         0 1       Home Medications    Prior to Admission medications   Medication Sig Start Date End Date Taking? Authorizing Provider  cephALEXin (KEFLEX) 250 MG capsule Take 1 capsule (250 mg total) by mouth 4 (four) times daily. 03/22/17   Bahja Bence, PA-C  docusate sodium (COLACE) 100 MG capsule Take 1 capsule (100 mg total) by mouth 2 (two) times daily. 10/26/14   Waynard Reeds, MD  ibuprofen (ADVIL,MOTRIN) 600 MG tablet Take 1 tablet (600 mg total) by mouth  every 6 (six) hours as needed. 10/26/14   Waynard Reeds, MD  oxyCODONE-acetaminophen (ROXICET) 5-325 MG per tablet Take 1-2 tablets by mouth every 4 (four) hours as needed for severe pain. 10/26/14   Waynard Reeds, MD  Prenatal Vit-Fe Fumarate-FA (MULTIVITAMIN-PRENATAL) 27-0.8 MG TABS tablet Take 1 tablet by mouth daily at 12 noon.    [provider]    Family History History reviewed. No pertinent family history.  Social History Social History  Substance Use Topics  . Smoking status: Never Smoker  . Smokeless tobacco: Not on file  . Alcohol use Yes     Comment: occ     Allergies   Patient has no known allergies.   Review of Systems Review of Systems  Skin: Positive for wound.  Neurological: Negative for numbness.  Hematological: Does not bruise/bleed easily.     Physical Exam Updated Vital Signs BP 112/77   Pulse 93   Temp 98.2 F (36.8 C) (Oral)   Resp 17   Ht 5\' 2"  (1.575 m)   Wt 58.5 kg (129 lb)   LMP 03/14/2017   SpO2 100%   Breastfeeding? No   BMI 23.59 kg/m   Physical Exam  Constitutional: She is oriented to person, place, and time. She appears well-developed and well-nourished. No distress.  HENT:  Head: Normocephalic and atraumatic.  Eyes: EOM are normal.  Neck: Normal range of motion.  Pulmonary/Chest: Effort normal.  Abdominal: She exhibits no distension.  Musculoskeletal:  Unable to extend left middle finger.  Sensation intact in all fingers.  Radial pulses intact bilaterally.  Full active range of motion of right hand and fingers.  Sensation intact.  Neurological: She is alert and oriented to person, place, and time.  Skin: Skin is warm. No rash noted.  1 cm laceration of dorsal right ring finger between DIP and PIP.  0.5 cm laceration of dorsal left hand at third MCP.  0.25 cm laceration at posterior left elbow.  No active bleeding at this time at any site.  Psychiatric: She has a normal mood and affect.  Nursing note and vitals  reviewed.    ED Treatments / Results  Labs (all labs ordered are listed, but only abnormal results are displayed) Labs Reviewed - No data to display  EKG  EKG Interpretation None       Radiology Dg Hand Complete Left  Result Date: 03/22/2017 CLINICAL DATA:  Laceration to the left hand from glass. Initial encounter. EXAM: LEFT HAND - COMPLETE 3+ VIEW COMPARISON:  None. FINDINGS: There is no evidence of fracture or dislocation. The joint spaces are preserved. The carpal rows are intact, and demonstrate normal alignment. A dorsal soft tissue laceration is noted at the hand. No radiopaque foreign bodies are seen. IMPRESSION: 1. No evidence of fracture or dislocation. 2. No radiopaque foreign bodies seen. Electronically Signed   By: Roanna Raider M.D.   On: 03/22/2017 00:17   Dg Finger Ring Right  Result Date: 03/22/2017 CLINICAL DATA:  26 year old female with laceration of the right ring finger. EXAM: RIGHT RING FINGER 2+V COMPARISON:  None. FINDINGS: There is no acute fracture or dislocation. The bones are well mineralized. No arthritic changes. There is laceration of the skin over the dorsum of the finger. No radiopaque foreign object. IMPRESSION: 1. No acute osseous pathology. 2. No radiopaque foreign object. Electronically Signed   By: Elgie Collard M.D.   On: 03/22/2017 00:17    Procedures .Marland KitchenLaceration Repair Date/Time: 03/21/2017 11:00 PM Performed by: Alveria Apley Authorized by: Alveria Apley   Consent:    Consent obtained:  Verbal   Consent given by:  Patient   Risks discussed:  Pain, poor cosmetic result, poor wound healing and infection Anesthesia (see MAR for exact dosages):    Anesthesia method:  Local infiltration   Local anesthetic:  Lidocaine 2% w/o epi Laceration details:    Location:  Hand   Hand location:  R hand, dorsum   Length (cm):  1   Depth (mm):  1.5 Repair type:    Repair type:  Simple Pre-procedure details:    Preparation:  Imaging  obtained to evaluate for foreign bodies Exploration:    Wound exploration: wound explored through full range of motion and entire depth of wound probed and visualized     Wound extent: no muscle damage noted and no tendon damage noted     Wound extent comment:  No obvious tendon or muscle involvement/exposure Treatment:    Area cleansed with:  Betadine   Amount of cleaning:  Standard   Irrigation solution:  Sterile saline   Irrigation volume:  500   Irrigation method:  Syringe Skin repair:    Repair method:  Sutures   Suture size:  5-0   Suture material:  Prolene   Suture technique:  Simple interrupted  Number of sutures:  3 Approximation:    Approximation:  Close Post-procedure details:    Dressing:  Bulky dressing   Patient tolerance of procedure:  Tolerated well, no immediate complications .Marland KitchenLaceration Repair Date/Time: 03/22/2017 12:15 AM Performed by: Alveria Apley Authorized by: Alveria Apley   Consent:    Consent obtained:  Verbal   Consent given by:  Patient   Risks discussed:  Infection, pain, poor cosmetic result and poor wound healing Anesthesia (see MAR for exact dosages):    Anesthesia method:  Local infiltration   Local anesthetic:  Lidocaine 2% w/o epi Laceration details:    Location:  Hand   Hand location:  L hand, dorsum   Length (cm):  0.5   Depth (mm):  2 Repair type:    Repair type:  Simple Pre-procedure details:    Preparation:  Imaging obtained to evaluate for foreign bodies Exploration:    Wound exploration: wound explored through full range of motion and entire depth of wound probed and visualized     Wound extent comment:  No obvious tendons or muscles exposed Treatment:    Area cleansed with:  Betadine   Amount of cleaning:  Standard   Irrigation solution:  Sterile saline   Irrigation volume:  500   Irrigation method:  Syringe Skin repair:    Repair method:  Sutures   Suture size:  5-0   Suture material:  Prolene   Suture  technique:  Simple interrupted   Number of sutures:  1 Approximation:    Approximation:  Close Post-procedure details:    Dressing:  Sterile dressing   Patient tolerance of procedure:  Tolerated well, no immediate complications   (including critical care time)  Medications Ordered in ED Medications  lidocaine (XYLOCAINE) 2 % (with pres) injection 200 mg (200 mg Infiltration Given by Other 03/21/17 2321)  ondansetron (ZOFRAN-ODT) disintegrating tablet 4 mg (4 mg Oral Given 03/21/17 2341)  cephALEXin (KEFLEX) capsule 250 mg (250 mg Oral Given 03/22/17 0033)     Initial Impression / Assessment and Plan / ED Course  I have reviewed the triage vital signs and the nursing notes.  Pertinent labs & imaging results that were available during my care of the patient were reviewed by me and considered in my medical decision making (see chart for details).     Patient presenting with multiple lacerations after breaking through glass.  She is unable to extend her left middle finger.  Sensation intact.  Radial pulses intact.  Will order x-ray to ensure no glass is retained.  Left elbow lack minimal and without bleeding.  Will clean and place bandage.  Will repair right ring finger laceration and consult with hand surgery about L hand lac.  Discussed case with Dr. Amanda Pea from hand surgery.  Will place 1 stitch to close left dorsal laceration, start patient on antibiotics, and have her follow-up in the office at 3 PM tomorrow.  Start patient on Keflex.  Aftercare instructions given for sutures.  Discussed plan with patient.  X-ray negative for fracture or foreign body.  At this time, patient appears safe for discharge.  Return precautions given.  Patient states she understands and agrees to plan.   Final Clinical Impressions(s) / ED Diagnoses   Final diagnoses:  Laceration of right ring finger without foreign body without damage to nail, initial encounter  Laceration of left hand without foreign  body, initial encounter    New Prescriptions Discharge Medication List as of 03/22/2017 12:19 AM  START taking these medications   Details  cephALEXin (KEFLEX) 250 MG capsule Take 1 capsule (250 mg total) by mouth 4 (four) times daily., Starting Wed 03/22/2017, Print         Brandywineaccavale, FriesvilleSophia, PA-C 03/22/17 0148    Vanetta MuldersZackowski, Scott, MD 03/23/17 [phone]

## 2017-03-21 NOTE — ED Triage Notes (Signed)
Pt has skin tear/laceration to her right hand and abrasions to left hand after trying to throw a rock through her window to get in her house because she was locked out. Bleeding controlled, pt has full ROM of both hands. VSS.

## 2017-03-22 MED ORDER — CEPHALEXIN 250 MG PO CAPS: 250.0000 mg | ORAL_CAPSULE | Freq: Four times a day (QID) | ORAL | 0 refills | Status: AC

## 2017-03-22 MED ORDER — CEPHALEXIN 250 MG PO CAPS
250.0000 mg | ORAL_CAPSULE | Freq: Once | ORAL | Status: AC
  Administered 2017-03-22: 250 mg via ORAL
  Filled 2017-03-22: qty 1

## 2017-03-22 NOTE — Discharge Instructions (Signed)
Follow up with Dr. Amanda PeaGramig tomorrow at 3 PM.  Keep the dressings on until you follow-up with the hand doctor. Take Keflex 4 times a day. Keep the cuts clean and dry. Use Tylenol or ibuprofen as needed for pain. Return to the emergency room if you develop fevers, pus draining from the cuts, or any new or worsening symptoms.

## 2017-03-23 ENCOUNTER — Encounter (HOSPITAL_COMMUNITY): Payer: Self-pay | Admitting: *Deleted

## 2017-03-23 NOTE — Progress Notes (Signed)
Spoke with pt for pre-op call. Pt denies cardiac history or diabetes.  

## 2017-03-24 ENCOUNTER — Encounter (HOSPITAL_COMMUNITY)
Admission: RE | Disposition: A | Discharge status: Home / Self Care | Payer: Self-pay | Source: Ambulatory Visit | Attending: Orthopedic Surgery

## 2017-03-24 ENCOUNTER — Encounter (HOSPITAL_COMMUNITY): Payer: Self-pay | Admitting: *Deleted

## 2017-03-24 ENCOUNTER — Ambulatory Visit (HOSPITAL_COMMUNITY): Payer: BLUE CROSS/BLUE SHIELD | Admitting: Anesthesiology

## 2017-03-24 ENCOUNTER — Ambulatory Visit (HOSPITAL_COMMUNITY)
Admission: RE | Admit: 2017-03-24 | Discharge: 2017-03-24 | Disposition: A | Discharge status: Home / Self Care | Payer: BLUE CROSS/BLUE SHIELD | Source: Ambulatory Visit | Attending: Orthopedic Surgery | Admitting: Orthopedic Surgery

## 2017-03-24 DIAGNOSIS — S61412A Laceration without foreign body of left hand, initial encounter: Secondary | ICD-10-CM | POA: Diagnosis present

## 2017-03-24 DIAGNOSIS — F17290 Nicotine dependence, other tobacco product, uncomplicated: Secondary | ICD-10-CM | POA: Insufficient documentation

## 2017-03-24 HISTORY — PX: TENDON REPAIR: SHX5111

## 2017-03-24 HISTORY — PX: I & D EXTREMITY: SHX5045

## 2017-03-24 HISTORY — DX: Headache: R51

## 2017-03-24 HISTORY — DX: Headache, unspecified: R51.9

## 2017-03-24 LAB — PREGNANCY, URINE: PREG TEST UR: NEGATIVE

## 2017-03-24 SURGERY — IRRIGATION AND DEBRIDEMENT EXTREMITY
Anesthesia: General | Site: Hand | Laterality: Left

## 2017-03-24 MED ORDER — OXYCODONE HCL 5 MG PO TABS: 5.0000 mg | ORAL_TABLET | Freq: Once | ORAL | Status: DC | PRN

## 2017-03-24 MED ORDER — PHENYLEPHRINE 40 MCG/ML (10ML) SYRINGE FOR IV PUSH (FOR BLOOD PRESSURE SUPPORT)
PREFILLED_SYRINGE | INTRAVENOUS | Status: AC
  Filled 2017-03-24: qty 10

## 2017-03-24 MED ORDER — BUPIVACAINE HCL (PF) 0.25 % IJ SOLN
INTRAMUSCULAR | Status: DC | PRN
  Administered 2017-03-24: 10 mL

## 2017-03-24 MED ORDER — CEFAZOLIN SODIUM-DEXTROSE 2-4 GM/100ML-% IV SOLN
2.0000 g | INTRAVENOUS | Status: AC
  Administered 2017-03-24: 2 g via INTRAVENOUS
  Filled 2017-03-24: qty 100

## 2017-03-24 MED ORDER — DEXAMETHASONE SODIUM PHOSPHATE 10 MG/ML IJ SOLN
INTRAMUSCULAR | Status: AC
  Filled 2017-03-24: qty 1

## 2017-03-24 MED ORDER — SUCCINYLCHOLINE CHLORIDE 200 MG/10ML IV SOSY
PREFILLED_SYRINGE | INTRAVENOUS | Status: AC
  Filled 2017-03-24: qty 10

## 2017-03-24 MED ORDER — HYDROCODONE-ACETAMINOPHEN 10-325 MG PO TABS: 1.0000 | ORAL_TABLET | Freq: Four times a day (QID) | ORAL | 0 refills | Status: AC | PRN

## 2017-03-24 MED ORDER — OXYCODONE HCL 5 MG/5ML PO SOLN: 5.0000 mg | Freq: Once | ORAL | Status: DC | PRN

## 2017-03-24 MED ORDER — LACTATED RINGERS IV SOLN
INTRAVENOUS | Status: DC | PRN
  Administered 2017-03-24 (×2): via INTRAVENOUS

## 2017-03-24 MED ORDER — EPHEDRINE 5 MG/ML INJ
INTRAVENOUS | Status: AC
  Filled 2017-03-24: qty 10

## 2017-03-24 MED ORDER — FENTANYL CITRATE (PF) 250 MCG/5ML IJ SOLN
INTRAMUSCULAR | Status: AC
  Filled 2017-03-24: qty 5

## 2017-03-24 MED ORDER — FENTANYL CITRATE (PF) 100 MCG/2ML IJ SOLN
INTRAMUSCULAR | Status: DC | PRN
  Administered 2017-03-24: 100 ug via INTRAVENOUS

## 2017-03-24 MED ORDER — SODIUM CHLORIDE 0.9 % IR SOLN
Status: DC | PRN
  Administered 2017-03-24: 1000 mL

## 2017-03-24 MED ORDER — LIDOCAINE 2% (20 MG/ML) 5 ML SYRINGE
INTRAMUSCULAR | Status: AC
  Filled 2017-03-24: qty 5

## 2017-03-24 MED ORDER — PHENYLEPHRINE HCL 10 MG/ML IJ SOLN
INTRAMUSCULAR | Status: DC | PRN
  Administered 2017-03-24: 40 ug via INTRAVENOUS
  Administered 2017-03-24 (×2): 80 ug via INTRAVENOUS

## 2017-03-24 MED ORDER — ONDANSETRON HCL 4 MG/2ML IJ SOLN
INTRAMUSCULAR | Status: DC | PRN
  Administered 2017-03-24: 4 mg via INTRAVENOUS

## 2017-03-24 MED ORDER — BUPIVACAINE HCL (PF) 0.25 % IJ SOLN
INTRAMUSCULAR | Status: AC
  Filled 2017-03-24: qty 30

## 2017-03-24 MED ORDER — LIDOCAINE HCL (CARDIAC) 20 MG/ML IV SOLN
INTRAVENOUS | Status: DC | PRN
  Administered 2017-03-24: 80 mg via INTRAVENOUS

## 2017-03-24 MED ORDER — MIDAZOLAM HCL 2 MG/2ML IJ SOLN
INTRAMUSCULAR | Status: AC
  Filled 2017-03-24: qty 2

## 2017-03-24 MED ORDER — MIDAZOLAM HCL 5 MG/5ML IJ SOLN
INTRAMUSCULAR | Status: DC | PRN
  Administered 2017-03-24: 2 mg via INTRAVENOUS

## 2017-03-24 MED ORDER — POVIDONE-IODINE 10 % EX SWAB: 2.0000 "application " | Freq: Once | CUTANEOUS | Status: DC

## 2017-03-24 MED ORDER — PROPOFOL 10 MG/ML IV BOLUS
INTRAVENOUS | Status: AC
  Filled 2017-03-24: qty 20

## 2017-03-24 MED ORDER — CHLORHEXIDINE GLUCONATE 4 % EX LIQD: 60.0000 mL | Freq: Once | CUTANEOUS | Status: DC

## 2017-03-24 MED ORDER — DEXAMETHASONE SODIUM PHOSPHATE 10 MG/ML IJ SOLN
INTRAMUSCULAR | Status: DC | PRN
  Administered 2017-03-24: 8 mg via INTRAVENOUS

## 2017-03-24 MED ORDER — HYDROMORPHONE HCL 1 MG/ML IJ SOLN: 0.2500 mg | INTRAMUSCULAR | Status: DC | PRN

## 2017-03-24 MED ORDER — ONDANSETRON HCL 4 MG/2ML IJ SOLN
INTRAMUSCULAR | Status: AC
  Filled 2017-03-24: qty 2

## 2017-03-24 SURGICAL SUPPLY — 45 items
BANDAGE ACE 4X5 VEL STRL LF (GAUZE/BANDAGES/DRESSINGS) ×1 IMPLANT
BNDG CONFORM 2 STRL LF (GAUZE/BANDAGES/DRESSINGS) ×2 IMPLANT
BNDG GAUZE ELAST 4 BULKY (GAUZE/BANDAGES/DRESSINGS) ×3 IMPLANT
CORDS BIPOLAR (ELECTRODE) ×3 IMPLANT
COVER SURGICAL LIGHT HANDLE (MISCELLANEOUS) ×3 IMPLANT
CUFF TOURNIQUET SINGLE 18IN (TOURNIQUET CUFF) ×3 IMPLANT
CUFF TOURNIQUET SINGLE 24IN (TOURNIQUET CUFF) IMPLANT
DRSG ADAPTIC 3X8 NADH LF (GAUZE/BANDAGES/DRESSINGS) ×3 IMPLANT
GAUZE SPONGE 4X4 12PLY STRL (GAUZE/BANDAGES/DRESSINGS) ×3 IMPLANT
GAUZE XEROFORM 1X8 LF (GAUZE/BANDAGES/DRESSINGS) ×3 IMPLANT
GLOVE BIOGEL M 8.0 STRL (GLOVE) ×3 IMPLANT
GLOVE SS BIOGEL STRL SZ 8 (GLOVE) ×1 IMPLANT
GLOVE SUPERSENSE BIOGEL SZ 8 (GLOVE) ×2
GOWN STRL REUS W/ TWL LRG LVL3 (GOWN DISPOSABLE) ×1 IMPLANT
GOWN STRL REUS W/ TWL XL LVL3 (GOWN DISPOSABLE) ×2 IMPLANT
GOWN STRL REUS W/TWL LRG LVL3 (GOWN DISPOSABLE) ×3
GOWN STRL REUS W/TWL XL LVL3 (GOWN DISPOSABLE) ×6
KIT BASIN OR (CUSTOM PROCEDURE TRAY) ×3 IMPLANT
KIT ROOM TURNOVER OR (KITS) ×3 IMPLANT
MANIFOLD NEPTUNE II (INSTRUMENTS) ×1 IMPLANT
NDL HYPO 25GX1X1/2 BEV (NEEDLE) IMPLANT
NEEDLE HYPO 25GX1X1/2 BEV (NEEDLE) ×3 IMPLANT
NS IRRIG 1000ML POUR BTL (IV SOLUTION) ×5 IMPLANT
PACK ORTHO EXTREMITY (CUSTOM PROCEDURE TRAY) ×3 IMPLANT
PAD ARMBOARD 7.5X6 YLW CONV (MISCELLANEOUS) ×3 IMPLANT
PAD CAST 3X4 CTTN HI CHSV (CAST SUPPLIES) IMPLANT
PAD CAST 4YDX4 CTTN HI CHSV (CAST SUPPLIES) ×1 IMPLANT
PADDING CAST COTTON 3X4 STRL (CAST SUPPLIES) ×6
PADDING CAST COTTON 4X4 STRL (CAST SUPPLIES)
SCOTCHCAST PLUS 2X4 WHITE (CAST SUPPLIES) ×2 IMPLANT
SCOTCHCAST PLUS 3X4 WHITE (CAST SUPPLIES) ×2 IMPLANT
SCRUB BETADINE 4OZ XXX (MISCELLANEOUS) ×3 IMPLANT
SOL PREP POV-IOD 4OZ 10% (MISCELLANEOUS) ×3 IMPLANT
SPONGE LAP 4X18 X RAY DECT (DISPOSABLE) ×1 IMPLANT
SUT FIBERWIRE 2-0 18 17.9 3/8 (SUTURE) ×6
SUT PROLENE 4 0 P 3 18 (SUTURE) ×2 IMPLANT
SUTURE FIBERWR 2-0 18 17.9 3/8 (SUTURE) IMPLANT
SWAB CULTURE ESWAB REG 1ML (MISCELLANEOUS) IMPLANT
SYR CONTROL 10ML LL (SYRINGE) ×2 IMPLANT
TOWEL OR 17X24 6PK STRL BLUE (TOWEL DISPOSABLE) ×3 IMPLANT
TOWEL OR 17X26 10 PK STRL BLUE (TOWEL DISPOSABLE) ×3 IMPLANT
TUBE CONNECTING 12'X1/4 (SUCTIONS) ×1
TUBE CONNECTING 12X1/4 (SUCTIONS) ×2 IMPLANT
WATER STERILE IRR 1000ML POUR (IV SOLUTION) ×1 IMPLANT
YANKAUER SUCT BULB TIP NO VENT (SUCTIONS) ×1 IMPLANT

## 2017-03-24 NOTE — Anesthesia Postprocedure Evaluation (Signed)
Anesthesia Post Note  Patient: Hospital doctorAmber D Ayala  Procedure(s) Performed: Irrigation and Debridement Left Hand (Left Hand) LEFT HAND EXTENSOR TENDONS REPAIR (Left Hand)     Patient location during evaluation: PACU Anesthesia Type: General Level of consciousness: awake and alert Pain management: pain level controlled Vital Signs Assessment: post-procedure vital signs reviewed and stable Respiratory status: spontaneous breathing, nonlabored ventilation, respiratory function stable and patient connected to nasal cannula oxygen Cardiovascular status: blood pressure returned to baseline and stable Postop Assessment: no apparent nausea or vomiting Anesthetic complications: no    Last Vitals:  Vitals:   03/24/17 0935 03/24/17 0936  BP: (!) 112/59   Pulse: 80 80  Resp: 12 12  Temp:  36.6 C  SpO2: 100% 100%    Last Pain:  Vitals:   03/24/17 0610  TempSrc: Oral                 Karver Fadden,JAMES TERRILL

## 2017-03-24 NOTE — Anesthesia Procedure Notes (Signed)
Procedure Name: LMA Insertion Date/Time: 03/24/2017 7:44 AM Performed by: Coralee RudFLORES, Liani Caris Pre-anesthesia Checklist: Patient identified, Emergency Drugs available, Suction available and Patient being monitored Patient Re-evaluated:Patient Re-evaluated prior to induction Oxygen Delivery Method: Circle system utilized Preoxygenation: Pre-oxygenation with 100% oxygen Induction Type: IV induction Ventilation: Mask ventilation without difficulty LMA: LMA inserted LMA Size: 4.0 Number of attempts: 1 Placement Confirmation: positive ETCO2 Tube secured with: Tape Dental Injury: Teeth and Oropharynx as per pre-operative assessment

## 2017-03-24 NOTE — Op Note (Deleted)
  The note originally documented on this encounter has been moved the the encounter in which it belongs.  

## 2017-03-24 NOTE — H&P (Signed)
Whitney Ayala is an 26 y.o. Whitney Ayala.   Chief Complaint: left hand laceration HPI: Patient presents for evaluation and treatment of the of their upper extremity predicament. The patient denies neck, back, chest or  abdominal pain. The patient notes that they have no lower extremity problems. The patients primary complaint is noted. We are planning surgical care pathway for the upper extremity.   Past Medical History:  Diagnosis Date  . Headache    migraines as a child/teens  . Kidney infection     Past Surgical History:  Procedure Laterality Date  . CESAREAN SECTION N/A 10/24/2014   Procedure: CESAREAN SECTION;  Surgeon: Whitney HartWalda Pinn, MD;  Location: [facility];  Service: Obstetrics;  Laterality: N/A;    History reviewed. No pertinent family history. Social History:  reports that she has been smoking Cigars.  She has never used smokeless tobacco. She reports that she drinks alcohol. She reports that she does not use drugs.  Allergies: No Known Allergies  Medications Prior to Admission  Medication Sig Dispense Refill  . cephALEXin (KEFLEX) 250 MG capsule Take 1 capsule (250 mg total) by mouth 4 (four) times daily. 28 capsule 0  . levonorgestrel-ethinyl estradiol (LEVORA 0.15/30, 28,) 0.15-30 MG-MCG tablet Take 1 tablet by mouth daily at 2 PM. 1430      No results found for this or any previous visit (from the past 48 hour(s)). No results found.  ROS  Blood pressure 121/77, pulse 74, temperature 98.3 F (36.8 C), temperature source Oral, resp. rate 16, last menstrual period 03/14/2017, SpO2 100 %. Physical Exam  Left hand laceration with 3rd EDC laceration The patient is alert and oriented in no acute distress. The patient complains of pain in the affected upper extremity.  The patient is noted to have a normal HEENT exam. Lung fields show equal chest expansion and no shortness of breath. Abdomen exam is nontender without distention. Lower extremity examination does not show any  fracture dislocation or blood clot symptoms. Pelvis is stable and the neck and back are stable and nontender.  Laceration to the elbow left and right finger noted and stable     Assessment/Plan We are planning surgery for your upper extremity. The risk and benefits of surgery to include risk of bleeding, infection, anesthesia,  damage to normal structures and failure of the surgery to accomplish its intended goals of relieving symptoms and restoring function have been discussed in detail. With this in mind we plan to proceed. I have specifically discussed with the patient the pre-and postoperative regime and the dos and don'ts and risk and benefits in great detail. Risk and benefits of surgery also include risk of dystrophy(CRPS), chronic nerve pain, failure of the healing process to go onto completion and other inherent risks of surgery The relavent the pathophysiology of the disease/injury process, as well as the alternatives for treatment and postoperative course of action has been discussed in great detail with the patient who desires to proceed.  We will do everything in our power to help you (the patient) restore function to the upper extremity. It is a pleasure to see this patient today.   Karen ChafeGRAMIG III,Whitney Chaudoin M, MD 03/24/2017, 6:26 AM

## 2017-03-24 NOTE — Op Note (Signed)
Whitney Ayala, Whitney Ayala                  ACCOUNT NO.:  [id number]  MEDICAL RECORD NO.:  [id number]  LOCATION:                               FACILITY:  [facility]  PHYSICIAN:  Dionne Ano. Melchor Kirchgessner, M.D.DATE OF BIRTH:  31-Dec-1990  DATE OF PROCEDURE: DATE OF DISCHARGE:  03/24/2017                              OPERATIVE REPORT   PREOPERATIVE DIAGNOSIS:  Left hand laceration x2 over the MCP regions with extensor tendon injury.  POSTOPERATIVE DIAGNOSIS:  Left hand laceration x2 over the MCP regions with extensor tendon injury.  PROCEDURES: 1. Irrigation and debridement of skin, subcutaneous tissue, tendon,     and muscle tissue.  This was an excisional debridement with     curette, knife, blade, and scissor tip, 3 x 2 cm. 2. Repair of extensor digitorum communis with a 4-strand FiberWire     technique, left middle finger EDC (extensor digitorum communis). 3. Repair of extensor digitorum communis, ring finger, left hand.  SURGEON:  Dionne Ano. Amanda Pea, MD.  ASSISTANT:  None.  COMPLICATIONS:  None.  ANESTHESIA:  General LMA anesthetic.  TOURNIQUET TIME:  Less than an hour.  INDICATIONS FOR PROCEDURE:  A 26 year old female status post glass injury.  She has an injury to her right ring finger as well as her left elbow.  These are stable injuries without neurovascular or tendon deficit.  Unfortunately, the left hand has significant tendon deficit. I have counseled her in regard to risks and benefits of surgery and we are going to plan for operative intervention.  OPERATIVE PROCEDURE:  The patient was seen by myself and Anesthesia, taken to the operative theater, underwent smooth induction of general LMA anesthetic.  Time-out observed.  Hibiclens pre-scrub by myself was performed followed by 10-minute surgical Betadine scrub followed by outline marks being made visually and an incision was made along the previous lacerations.  I exposed the area and performed irrigation and debridement of skin,  subcutaneous tissue, tendon, and muscle.  3-4 L of saline was placed and the excisional debridement with knife, curette, and scissor tip was accomplished without difficulty.  This was an excisional debridement.  Following this, I then coapted the EDC to the middle finger.  Both ends were retrieved.  A 4-0 FiberWire and a 2-0 FiberWire were used to achieve a 4-strand repair with modified [place] to [place] type stitch.  Following this, the EDC to the ring finger was addressed with repair technique as well.  The area was freshened, repaired and there were no complicating features.  I checked the tenodesis affect and all looked quite well.  I was pleased with this and the findings.  The patient tolerated this nicely.  We then irrigated and closed the wound with Prolene with tourniquet deflated.  Hemostasis secured.  10 mL of Sensorcaine without epinephrine was placed in the wound for postop analgesia.  She was placed in a cast, keeping the MCPs and wrist in extension, thumb free.  We will see her back in 12 days.  In 12 days, we will remove her sutures, apply cast and wait an additional 2 weeks. Four weeks' postop active range of motion and therapeutic endeavors.  We have discussed all  issues at length, do's and don'ts, etc.  She will complete her antibiotics, and we have given her hydrocodone p.r.n. pain.     Dionne AnoWilliam M. Amanda PeaGramig, M.D.     [facility]  D:  03/24/2017  T:  03/24/2017  Job:  [id number]

## 2017-03-24 NOTE — Transfer of Care (Signed)
Immediate Anesthesia Transfer of Care Note  Patient: Whitney Ayala  Procedure(s) Performed: Irrigation and Debridement Left Hand (Left Hand) LEFT HAND EXTENSOR TENDONS REPAIR (Left Hand)  Patient Location: PACU  Anesthesia Type:General  Level of Consciousness: awake, oriented and sedated  Airway & Oxygen Therapy: Patient Spontanous Breathing and Patient connected to nasal cannula oxygen  Post-op Assessment: Report given to RN, Post -op Vital signs reviewed and stable and Patient moving all extremities  Post vital signs: Reviewed and stable  Last Vitals:  Vitals:   03/24/17 0610  BP: 121/77  Pulse: 74  Resp: 16  Temp: 36.8 C  SpO2: 100%    Last Pain:  Vitals:   03/24/17 0610  TempSrc: Oral      Patients Stated Pain Goal: 3 (03/24/17 0603)  Complications: No apparent anesthesia complications

## 2017-03-24 NOTE — Discharge Instructions (Signed)
We recommend that you to take vitamin C 1000 mg a day to promote healing. We also recommend that if you require  pain medicine that you take a stool softener to prevent constipation as most pain medicines will have constipation side effects. We recommend either Peri-Colace or Senokot and recommend that you also consider adding MiraLAX as well to prevent the constipation affects from pain medicine if you are required to use them. These medicines are over the counter and may be purchased at a local pharmacy. A cup of yogurt and a probiotic can also be helpful during the recovery process as the medicines can disrupt your intestinal environment. Keep bandage clean and dry.  Call for any problems.  No smoking.  Criteria for driving a car: you should be off your pain medicine for 7-8 hours, able to drive one handed(confident), thinking clearly and feeling able in your judgement to drive. Continue elevation as it will decrease swelling.   Use ice if instructed by your MD. Call immediately for any sudden loss of feeling in your hand/arm or change in functional abilities of the extremity.

## 2017-03-24 NOTE — Anesthesia Preprocedure Evaluation (Signed)
Anesthesia Evaluation  Patient identified by MRN, date of birth, ID band Patient awake    Reviewed: Allergy & Precautions, NPO status , Patient's Chart, lab work & pertinent test results  Airway Mallampati: I  TM Distance: >3 FB Neck ROM: Full    Dental  (+) Teeth Intact   Pulmonary neg pulmonary ROS, Current Smoker,    breath sounds clear to auscultation       Cardiovascular negative cardio ROS   Rhythm:Regular Rate:Normal     Neuro/Psych negative neurological ROS  negative psych ROS   GI/Hepatic negative GI ROS, Neg liver ROS,   Endo/Other  negative endocrine ROS  Renal/GU negative Renal ROS  negative genitourinary   Musculoskeletal negative musculoskeletal ROS (+)   Abdominal   Peds negative pediatric ROS (+)  Hematology negative hematology ROS (+)   Anesthesia Other Findings   Reproductive/Obstetrics negative OB ROS                             Anesthesia Physical Anesthesia Plan  ASA: I  Anesthesia Plan: General   Post-op Pain Management:    Induction: Intravenous  PONV Risk Score and Plan: 3 and Ondansetron, Dexamethasone, Midazolam and Propofol infusion  Airway Management Planned: LMA  Additional Equipment:   Intra-op Plan:   Post-operative Plan: Extubation in OR  Informed Consent: I have reviewed the patients History and Physical, chart, labs and discussed the procedure including the risks, benefits and alternatives for the proposed anesthesia with the patient or authorized representative who has indicated his/her understanding and acceptance.   Dental advisory given  Plan Discussed with: CRNA  Anesthesia Plan Comments:         Anesthesia Quick Evaluation

## 2017-03-24 NOTE — Op Note (Signed)
See dictation# [id number]  SP EDC repair left hand ring and middle fingers  [facility]

## 2017-03-25 ENCOUNTER — Encounter (HOSPITAL_COMMUNITY): Payer: Self-pay | Admitting: Orthopedic Surgery

## 2018-04-24 IMAGING — DX DG FINGER RING 2+V*R*
3 series · 3 of 3 positions shown · non-contrast
Comparison: None.

CLINICAL DATA: 26-year-old female with laceration of the right ring
finger.

EXAM:
RIGHT RING FINGER 2+V

[finger ap]
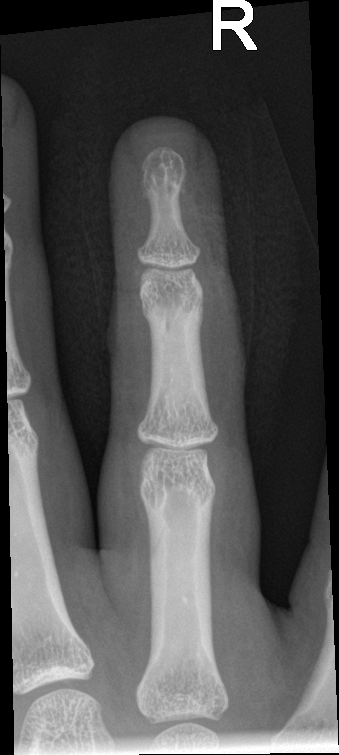

[finger obl]
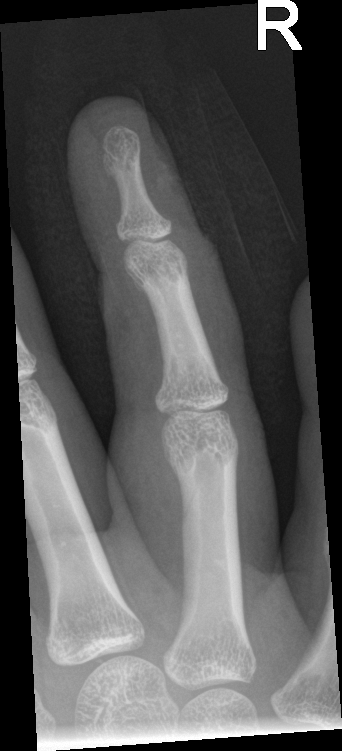

[finger lat]
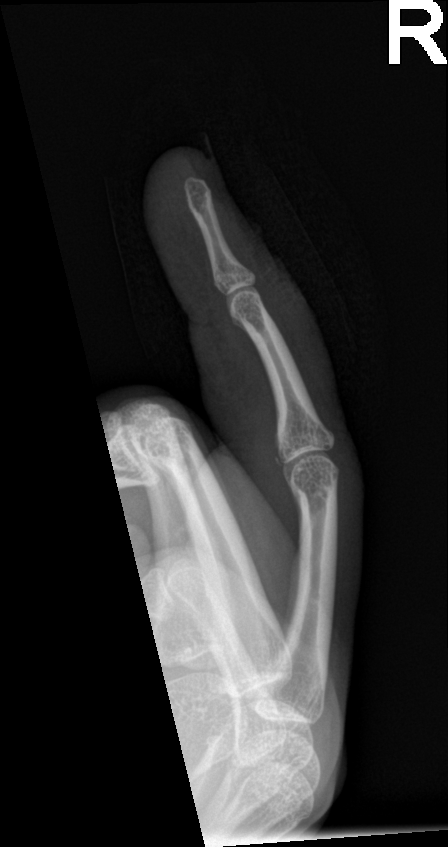

[3 of 3 positions shown; findings below may reference images not displayed]

FINDINGS: There is no acute fracture or dislocation. The bones are well
mineralized. No arthritic changes. There is laceration of the skin
over the dorsum of the finger. No radiopaque foreign object.
IMPRESSION: 1. No acute osseous pathology.
2. No radiopaque foreign object.

## 2018-06-15 DIAGNOSIS — Z3009 Encounter for other general counseling and advice on contraception: Secondary | ICD-10-CM | POA: Diagnosis not present

## 2018-06-15 DIAGNOSIS — Z124 Encounter for screening for malignant neoplasm of cervix: Secondary | ICD-10-CM | POA: Diagnosis not present

## 2018-06-15 DIAGNOSIS — R69 Illness, unspecified: Secondary | ICD-10-CM | POA: Diagnosis not present

## 2018-06-15 DIAGNOSIS — Z01419 Encounter for gynecological examination (general) (routine) without abnormal findings: Secondary | ICD-10-CM | POA: Diagnosis not present
